# Patient Record
Sex: Male | Born: 1985 | Race: Black or African American | Hispanic: No | Marital: Single | Smoking: Current every day smoker
Health system: Southern US, Community
[De-identification: ages and names within clinical notes are randomized; demographics above are authoritative.]

## PROBLEM LIST (undated history)

## (undated) DIAGNOSIS — I10 Essential (primary) hypertension: Secondary | ICD-10-CM

---
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
## 2013-05-23 ENCOUNTER — Emergency Department: Payer: Self-pay | Admitting: Emergency Medicine

## 2013-12-08 ENCOUNTER — Emergency Department: Payer: Self-pay | Admitting: Emergency Medicine

## 2013-12-16 ENCOUNTER — Emergency Department: Payer: Self-pay | Admitting: Emergency Medicine

## 2014-01-26 ENCOUNTER — Emergency Department: Payer: Self-pay | Admitting: Emergency Medicine
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
## 2015-07-06 ENCOUNTER — Emergency Department
Admission: EM | Admit: 2015-07-06 | Discharge: 2015-07-06 | Disposition: A | Discharge status: Home / Self Care | Payer: PRIVATE HEALTH INSURANCE | Attending: Emergency Medicine | Admitting: Emergency Medicine

## 2015-07-06 ENCOUNTER — Encounter: Payer: Self-pay | Admitting: Emergency Medicine

## 2015-07-06 DIAGNOSIS — Z119 Encounter for screening for infectious and parasitic diseases, unspecified: Secondary | ICD-10-CM

## 2015-07-06 DIAGNOSIS — F17200 Nicotine dependence, unspecified, uncomplicated: Secondary | ICD-10-CM | POA: Diagnosis not present

## 2015-07-06 DIAGNOSIS — I10 Essential (primary) hypertension: Secondary | ICD-10-CM | POA: Insufficient documentation

## 2015-07-06 DIAGNOSIS — Z7721 Contact with and (suspected) exposure to potentially hazardous body fluids: Secondary | ICD-10-CM | POA: Diagnosis present

## 2015-07-06 HISTORY — DX: Essential (primary) hypertension: I10
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
## 2015-07-06 NOTE — Discharge Instructions (Signed)
Results will be given to the county nurse and if further testing/treatment is necessary you will be notified.
